# Patient Record
Sex: Female | Born: 1935 | Hispanic: No | Smoking: Former smoker
Health system: Southern US, Community
[De-identification: ages and names within clinical notes are randomized; demographics above are authoritative.]

## PROBLEM LIST (undated history)

## (undated) DIAGNOSIS — I10 Essential (primary) hypertension: Secondary | ICD-10-CM

## (undated) DIAGNOSIS — G1221 Amyotrophic lateral sclerosis: Secondary | ICD-10-CM

## (undated) DIAGNOSIS — I471 Supraventricular tachycardia, unspecified: Secondary | ICD-10-CM

## (undated) DIAGNOSIS — J449 Chronic obstructive pulmonary disease, unspecified: Secondary | ICD-10-CM

## (undated) DIAGNOSIS — K219 Gastro-esophageal reflux disease without esophagitis: Secondary | ICD-10-CM

## (undated) DIAGNOSIS — M1990 Unspecified osteoarthritis, unspecified site: Secondary | ICD-10-CM

## (undated) DIAGNOSIS — E785 Hyperlipidemia, unspecified: Secondary | ICD-10-CM

## (undated) HISTORY — DX: Amyotrophic lateral sclerosis: G12.21

## (undated) HISTORY — PX: BACK SURGERY: SHX140

## (undated) HISTORY — DX: Gastro-esophageal reflux disease without esophagitis: K21.9

## (undated) HISTORY — DX: Supraventricular tachycardia: I47.1

## (undated) HISTORY — DX: Chronic obstructive pulmonary disease, unspecified: J44.9

## (undated) HISTORY — PX: CATARACT EXTRACTION: SUR2

## (undated) HISTORY — DX: Hyperlipidemia, unspecified: E78.5

## (undated) HISTORY — DX: Essential (primary) hypertension: I10

## (undated) HISTORY — DX: Unspecified osteoarthritis, unspecified site: M19.90

## (undated) HISTORY — DX: Supraventricular tachycardia, unspecified: I47.10

---

## 2003-11-15 ENCOUNTER — Ambulatory Visit: Payer: Self-pay | Admitting: Internal Medicine

## 2003-12-21 ENCOUNTER — Ambulatory Visit: Payer: Self-pay | Admitting: Internal Medicine

## 2004-04-12 ENCOUNTER — Ambulatory Visit: Payer: Self-pay | Admitting: Internal Medicine

## 2005-01-29 ENCOUNTER — Ambulatory Visit: Payer: Self-pay | Admitting: Internal Medicine

## 2006-07-17 ENCOUNTER — Ambulatory Visit: Payer: Self-pay | Admitting: Physician Assistant

## 2006-08-26 ENCOUNTER — Ambulatory Visit: Payer: Self-pay | Admitting: Pain Medicine

## 2006-09-10 ENCOUNTER — Ambulatory Visit: Payer: Self-pay | Admitting: Pain Medicine

## 2006-10-02 ENCOUNTER — Ambulatory Visit: Payer: Self-pay | Admitting: Physician Assistant

## 2006-10-14 ENCOUNTER — Emergency Department: Payer: Self-pay | Admitting: Emergency Medicine

## 2006-10-14 ENCOUNTER — Other Ambulatory Visit: Payer: Self-pay

## 2006-11-04 ENCOUNTER — Ambulatory Visit: Payer: Self-pay | Admitting: Pain Medicine

## 2006-11-05 ENCOUNTER — Ambulatory Visit: Payer: Self-pay | Admitting: Pain Medicine

## 2006-11-20 ENCOUNTER — Ambulatory Visit: Payer: Self-pay | Admitting: Physician Assistant

## 2006-12-02 ENCOUNTER — Ambulatory Visit: Payer: Self-pay | Admitting: Pain Medicine

## 2006-12-30 ENCOUNTER — Ambulatory Visit: Payer: Self-pay | Admitting: Pain Medicine

## 2006-12-31 ENCOUNTER — Ambulatory Visit: Payer: Self-pay | Admitting: Pain Medicine

## 2007-02-27 ENCOUNTER — Ambulatory Visit: Payer: Self-pay | Admitting: Family Medicine

## 2007-08-11 ENCOUNTER — Ambulatory Visit: Payer: Self-pay | Admitting: Unknown Physician Specialty

## 2007-09-17 ENCOUNTER — Ambulatory Visit: Payer: Self-pay | Admitting: Pain Medicine

## 2007-09-30 ENCOUNTER — Ambulatory Visit: Payer: Self-pay | Admitting: Pain Medicine

## 2007-10-14 ENCOUNTER — Ambulatory Visit: Payer: Self-pay | Admitting: Physician Assistant

## 2008-04-12 ENCOUNTER — Ambulatory Visit: Payer: Self-pay | Admitting: Internal Medicine

## 2008-09-30 ENCOUNTER — Ambulatory Visit: Payer: Self-pay | Admitting: Orthopedic Surgery

## 2008-10-08 ENCOUNTER — Ambulatory Visit: Payer: Self-pay | Admitting: Unknown Physician Specialty

## 2008-10-08 ENCOUNTER — Ambulatory Visit: Payer: Self-pay | Admitting: Cardiovascular Disease

## 2008-11-02 ENCOUNTER — Inpatient Hospital Stay: Payer: Self-pay | Admitting: Unknown Physician Specialty

## 2009-05-02 ENCOUNTER — Ambulatory Visit: Payer: Self-pay | Admitting: Internal Medicine

## 2009-12-18 ENCOUNTER — Observation Stay: Payer: Self-pay | Admitting: Specialist

## 2009-12-18 ENCOUNTER — Ambulatory Visit: Payer: Self-pay | Admitting: Internal Medicine

## 2010-05-09 ENCOUNTER — Ambulatory Visit: Payer: Self-pay | Admitting: Internal Medicine

## 2010-06-21 ENCOUNTER — Ambulatory Visit: Payer: Self-pay | Admitting: Unknown Physician Specialty

## 2011-10-30 ENCOUNTER — Ambulatory Visit: Payer: Self-pay | Admitting: Internal Medicine

## 2012-03-21 ENCOUNTER — Ambulatory Visit: Payer: Self-pay | Admitting: Unknown Physician Specialty

## 2012-04-14 ENCOUNTER — Ambulatory Visit: Payer: Self-pay | Admitting: Internal Medicine

## 2012-04-15 ENCOUNTER — Ambulatory Visit: Payer: Self-pay | Admitting: Internal Medicine

## 2012-04-28 ENCOUNTER — Ambulatory Visit: Payer: Self-pay | Admitting: Pain Medicine

## 2012-05-08 ENCOUNTER — Ambulatory Visit: Payer: Self-pay | Admitting: Pain Medicine

## 2012-05-28 ENCOUNTER — Ambulatory Visit: Payer: Self-pay | Admitting: Pain Medicine

## 2012-06-05 ENCOUNTER — Ambulatory Visit: Payer: Self-pay | Admitting: Pain Medicine

## 2012-06-24 ENCOUNTER — Ambulatory Visit: Payer: Self-pay | Admitting: Pain Medicine

## 2012-07-07 ENCOUNTER — Ambulatory Visit: Payer: Self-pay | Admitting: Pain Medicine

## 2012-11-26 ENCOUNTER — Ambulatory Visit: Payer: Self-pay | Admitting: Internal Medicine

## 2012-12-24 ENCOUNTER — Ambulatory Visit: Payer: Self-pay | Admitting: Ophthalmology

## 2013-01-21 ENCOUNTER — Ambulatory Visit: Payer: Self-pay | Admitting: Ophthalmology

## 2013-03-24 ENCOUNTER — Ambulatory Visit: Payer: Self-pay | Admitting: Neurology

## 2013-05-13 ENCOUNTER — Ambulatory Visit: Payer: Self-pay | Admitting: Pain Medicine

## 2013-06-01 ENCOUNTER — Ambulatory Visit: Payer: Self-pay | Admitting: Otolaryngology

## 2013-06-02 ENCOUNTER — Ambulatory Visit: Payer: Self-pay | Admitting: Pain Medicine

## 2013-06-22 ENCOUNTER — Other Ambulatory Visit: Payer: Self-pay | Admitting: Pain Medicine

## 2013-06-22 ENCOUNTER — Ambulatory Visit: Payer: Self-pay | Admitting: Pain Medicine

## 2013-06-22 LAB — HEPATIC FUNCTION PANEL A (ARMC)
ALBUMIN: 4.1 g/dL (ref 3.4–5.0)
ALK PHOS: 76 U/L
BILIRUBIN TOTAL: 0.4 mg/dL (ref 0.2–1.0)
Bilirubin, Direct: <0.1 mg/dL (ref 0.00–0.20)
SGOT(AST): 21 U/L (ref 15–37)
SGPT (ALT): 32 U/L (ref 12–78)
TOTAL PROTEIN: 7.1 g/dL (ref 6.4–8.2)

## 2013-06-22 LAB — BASIC METABOLIC PANEL
Anion Gap: 4 — ABNORMAL LOW (ref 7–16)
BUN: 16 mg/dL (ref 7–18)
CHLORIDE: 103 mmol/L (ref 98–107)
CO2: 33 mmol/L — AB (ref 21–32)
Calcium, Total: 9.3 mg/dL (ref 8.5–10.1)
Creatinine: 0.4 mg/dL — ABNORMAL LOW (ref 0.60–1.30)
EGFR (Non-African Amer.): >60
Glucose: 107 mg/dL — ABNORMAL HIGH (ref 65–99)
Osmolality: 281 (ref 275–301)
POTASSIUM: 3.9 mmol/L (ref 3.5–5.1)
Sodium: 140 mmol/L (ref 136–145)

## 2013-06-22 LAB — MAGNESIUM: Magnesium: 1.9 mg/dL

## 2013-06-22 LAB — SEDIMENTATION RATE: ERYTHROCYTE SED RATE: 17 mm/h (ref 0–30)

## 2013-07-13 ENCOUNTER — Ambulatory Visit: Payer: Self-pay | Admitting: Pain Medicine

## 2013-08-06 ENCOUNTER — Ambulatory Visit: Payer: Self-pay | Admitting: Neurology

## 2013-08-17 IMAGING — CT CT HEAD WITHOUT CONTRAST
1 series · 16 of 30 positions shown, 20 images · non-contrast
Comparison: none

REASON FOR EXAM: STAT CallReport [id number]  fall  pt hit right Carmino
eval fracture or hemorragia
COMMENTS:

PROCEDURE:     ABIGGY - FINA MIRALDA WITHOUT CONTRAST  - April 14, 2012 [DATE]
RESULT:     History: Fall.
Comparison Study: Head CT of 10/14/2006.

[Series 3: soft tissue · axial · 0.41mm/px · z∈[-124,+11]mm · 16 of 31 slices shown, 20 images]
[im 2/31  brain]
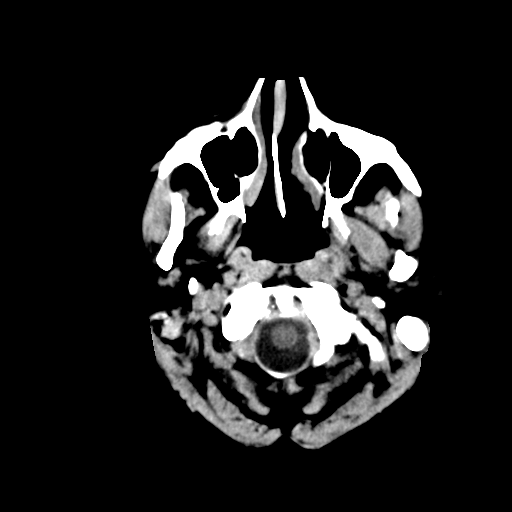
[im 2/31  bone]
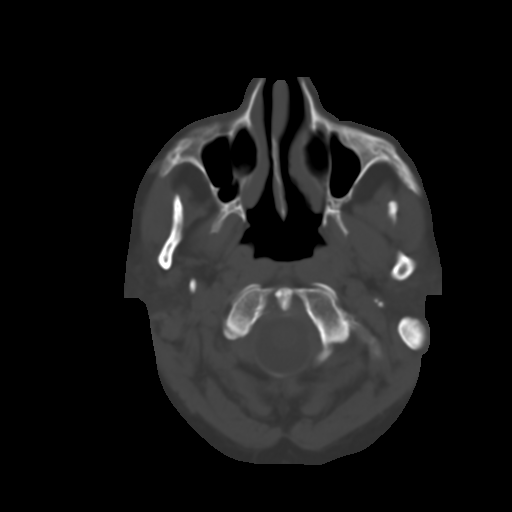
[im 4/31  brain]
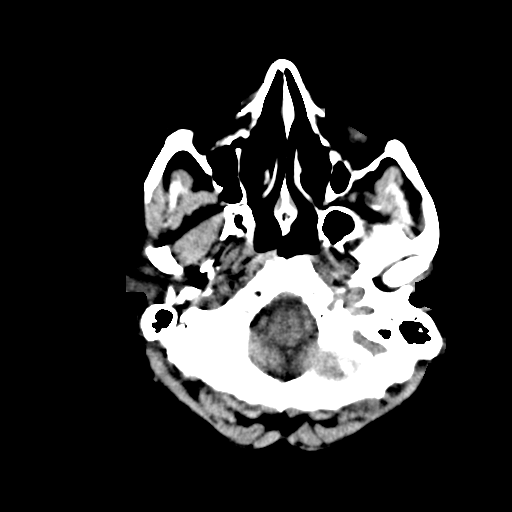
[im 6/31  brain]
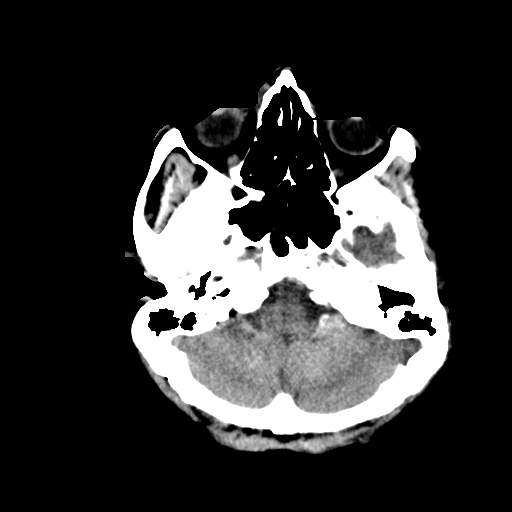
[im 8/31  brain]
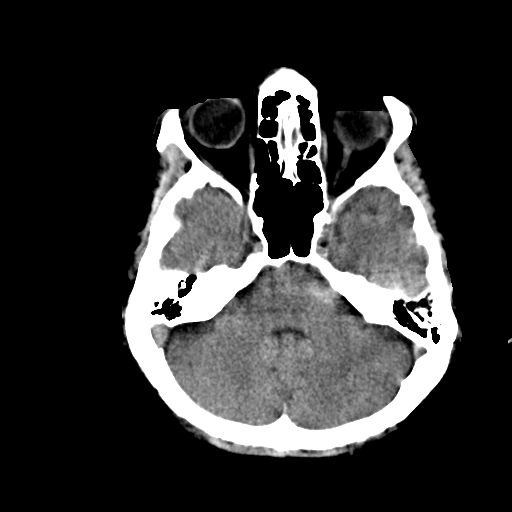
[im 9/31  brain]
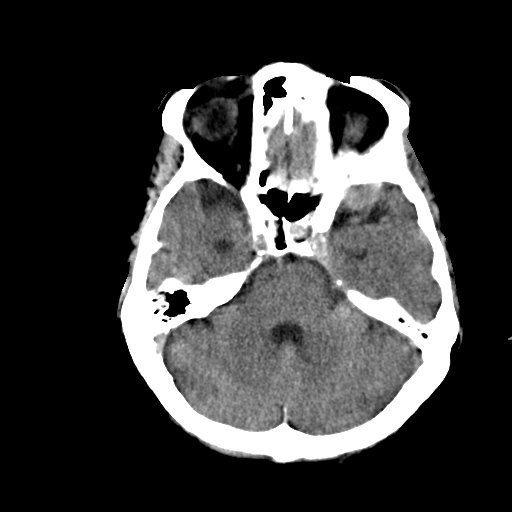
[im 9/31  bone]
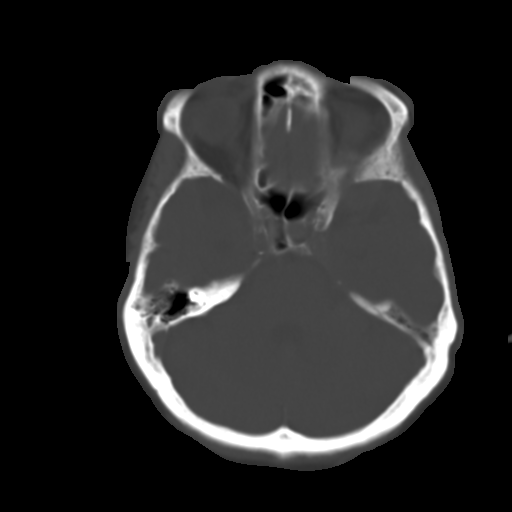
[im 11/31  brain]
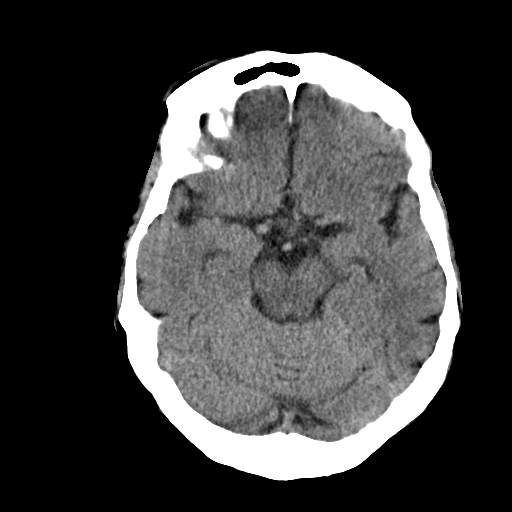
[im 13/31  brain]
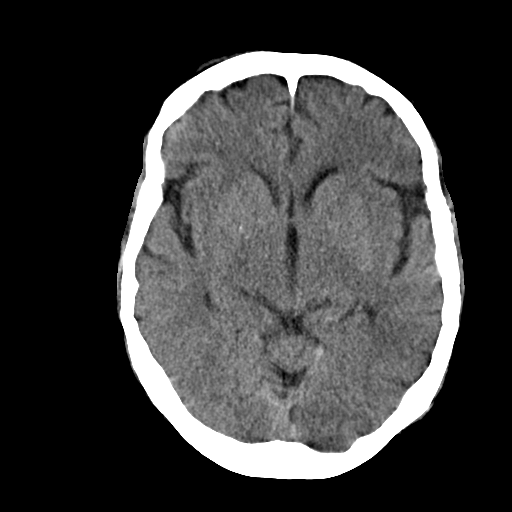
[im 15/31  brain]
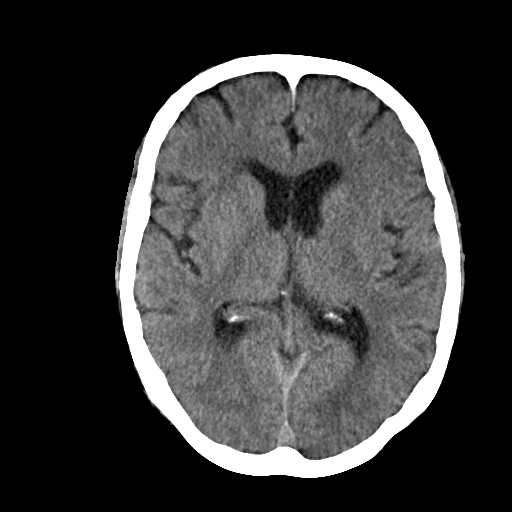
[im 16/31  brain]
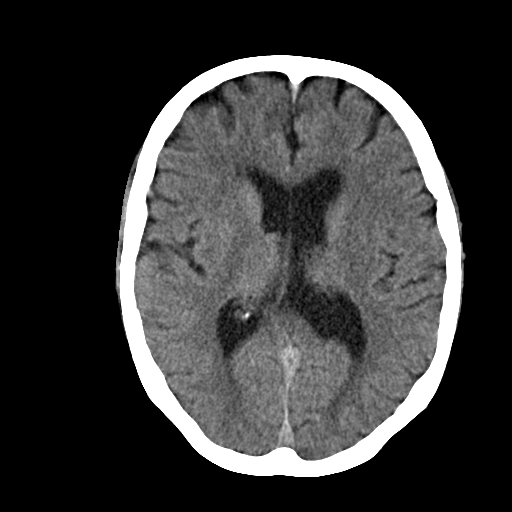
[im 16/31  bone]
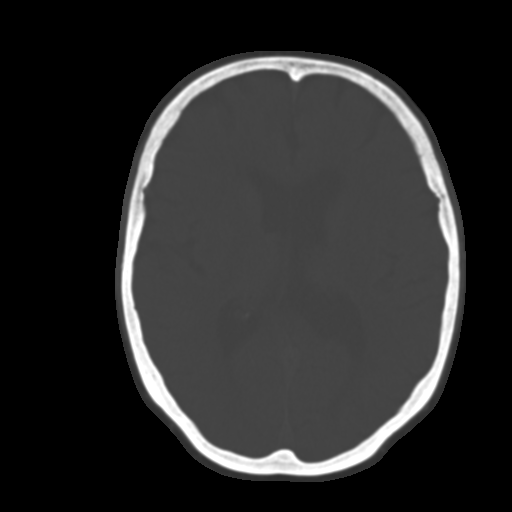
[im 18/31  brain]
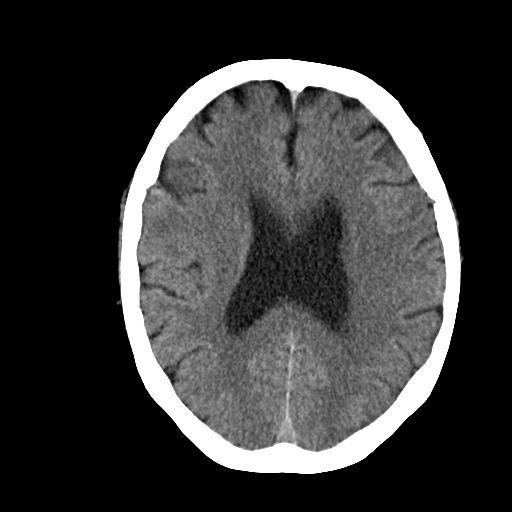
[im 20/31  brain]
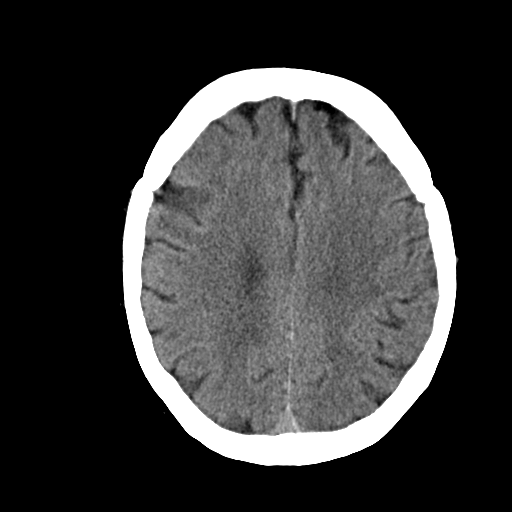
[im 22/31  brain]
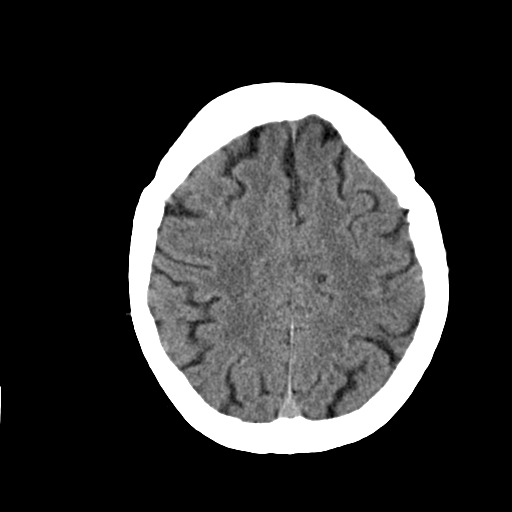
[im 23/31  brain]
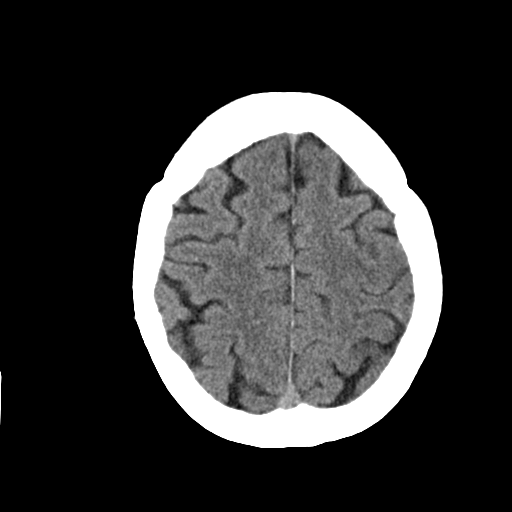
[im 23/31  bone]
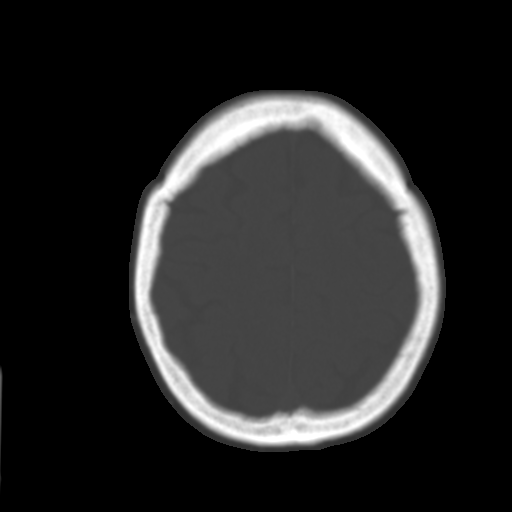
[im 25/31  brain]
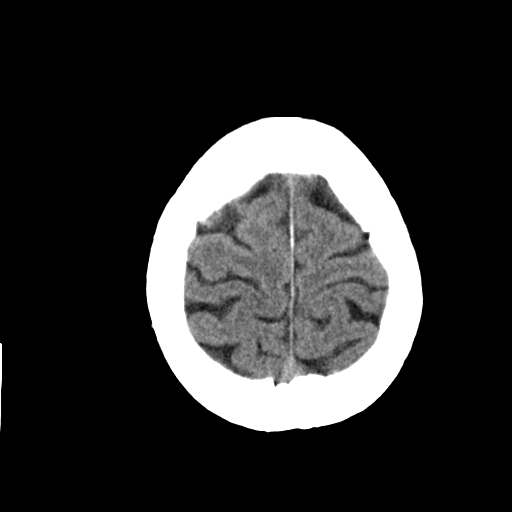
[im 27/31  brain]
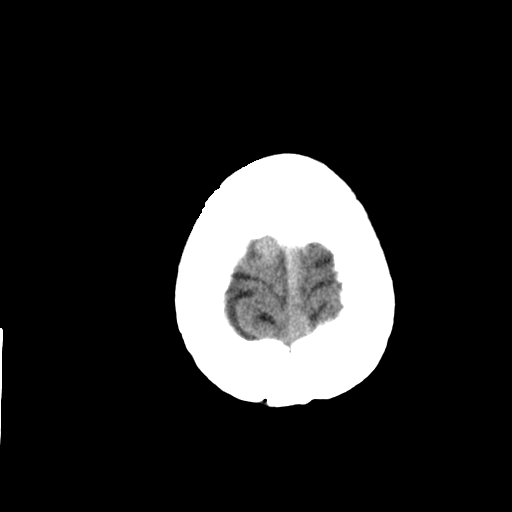
[im 29/31  brain]
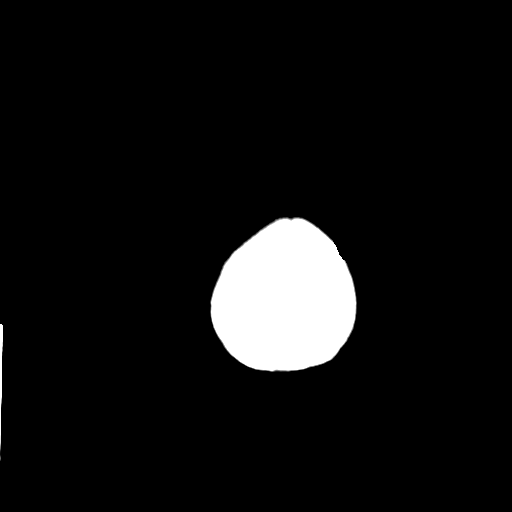

[16 of 30 positions shown; findings below may reference images not displayed]

FINDINGS: Findings: No mass. No hydrocephalus. Stable basal ganglia
calcification. Subtle increased density is no the region of the left
ventrolateral pons. Is most likely artifact however focal small hemorrhage
cannot be excluded followup scan an 12 24-hour suggested. No acute bony
abnormality. Report phoned to patient's physician at time of study.
IMPRESSION: Subtle focus of increased density noted adjacent to the
pons on the left. This is most likely artifact secondary to artifact, to
exclude  significant hemorrhage followup scan [DATE]-hour suggested .

## 2013-08-18 IMAGING — CT CT HEAD WITHOUT CONTRAST
1 series · 16 of 30 positions shown, 20 images · non-contrast
Comparison: none

REASON FOR EXAM: fall eval fracture or bleeding
COMMENTS:

[Series 2: soft tissue · axial · 0.39mm/px · z∈[-68,+67]mm · 16 of 30 slices shown, 20 images]
[im 2/30  brain]
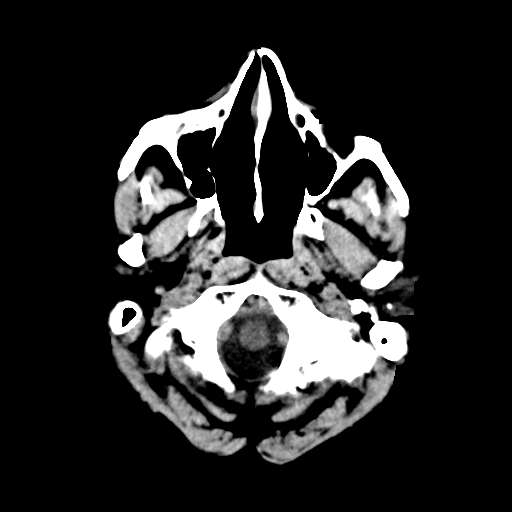
[im 2/30  bone]
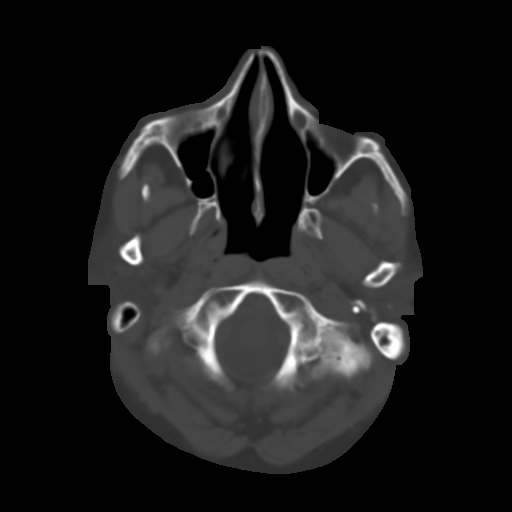
[im 4/30  brain]
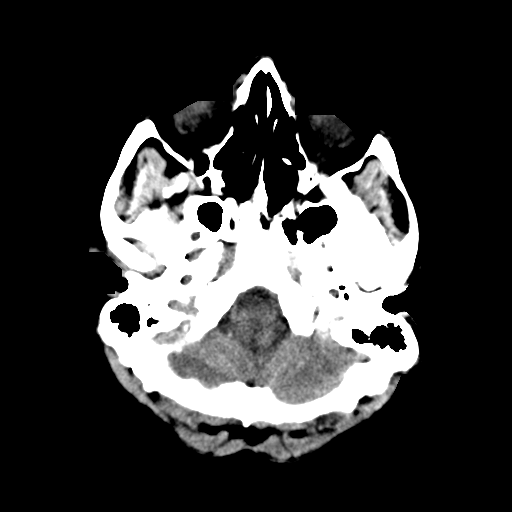
[im 6/30  brain]
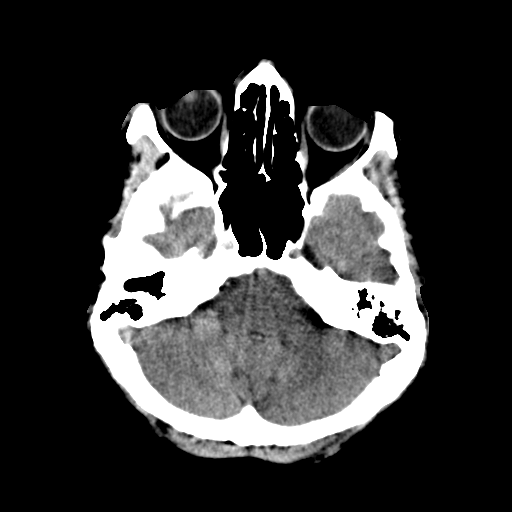
[im 8/30  brain]
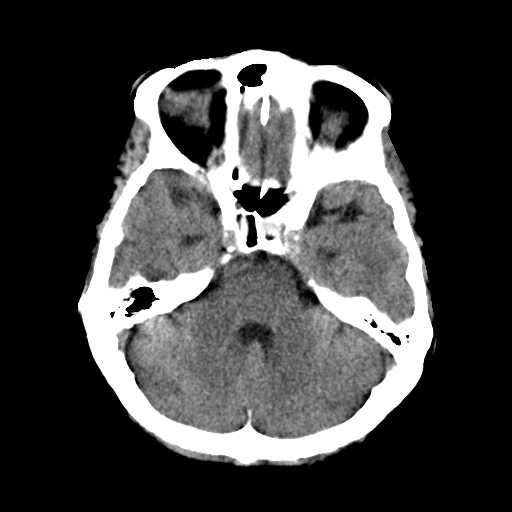
[im 9/30  brain]
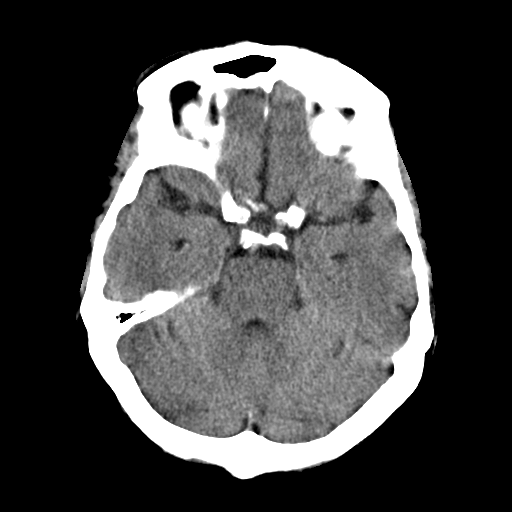
[im 9/30  bone]
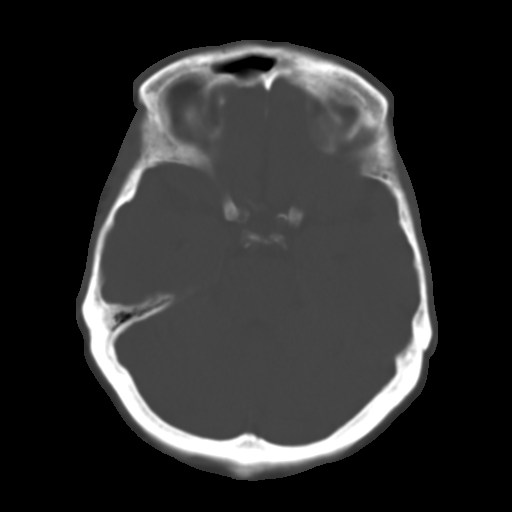
[im 11/30  brain]
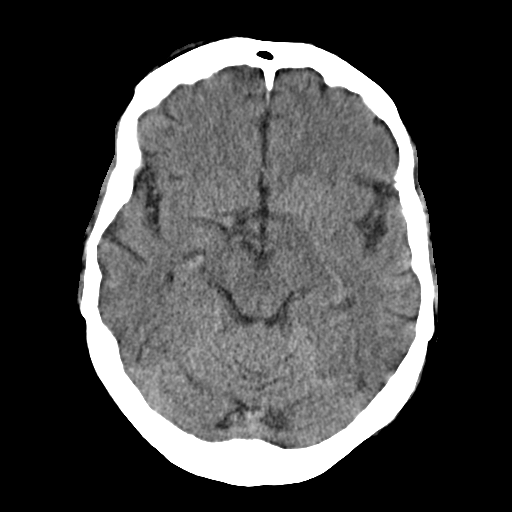
[im 13/30  brain]
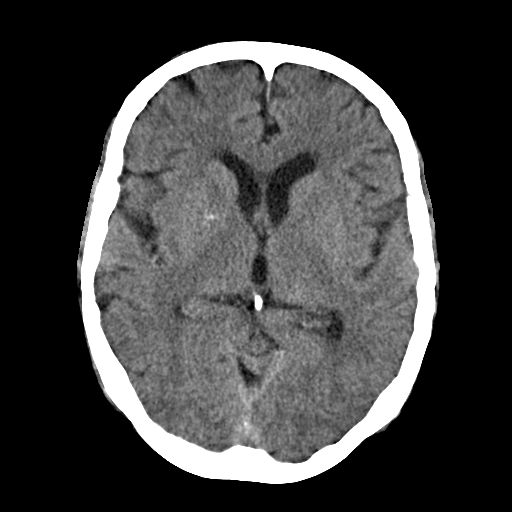
[im 15/30  brain]
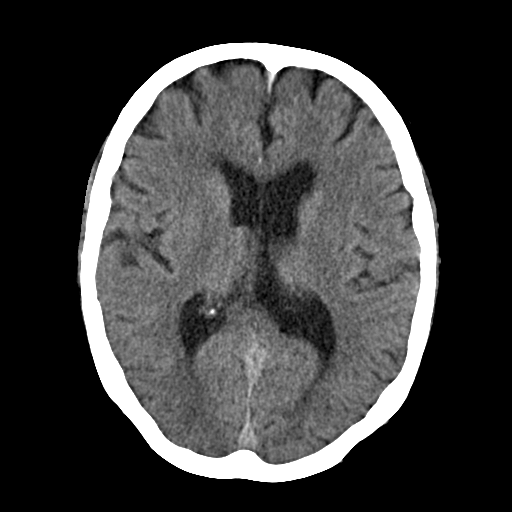
[im 16/30  brain]
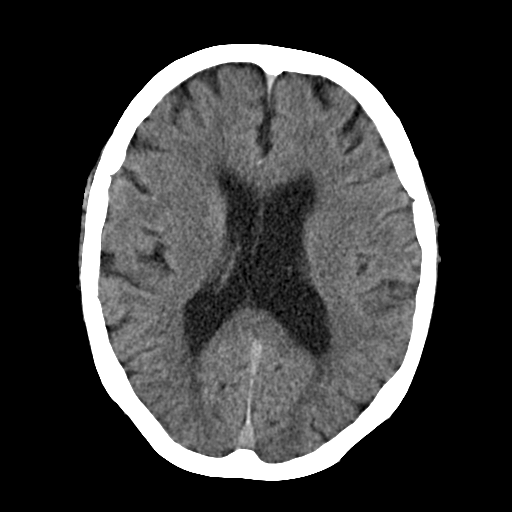
[im 16/30  bone]
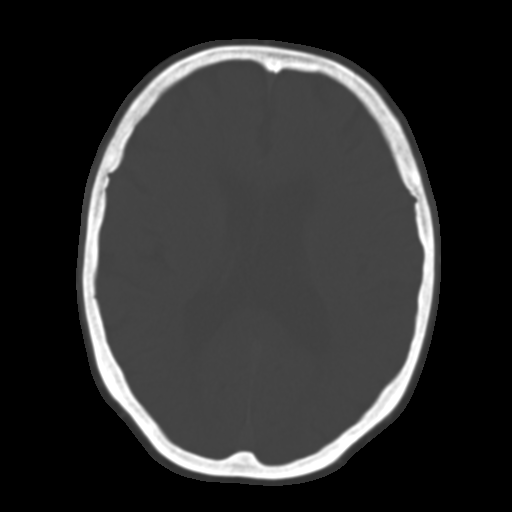
[im 18/30  brain]
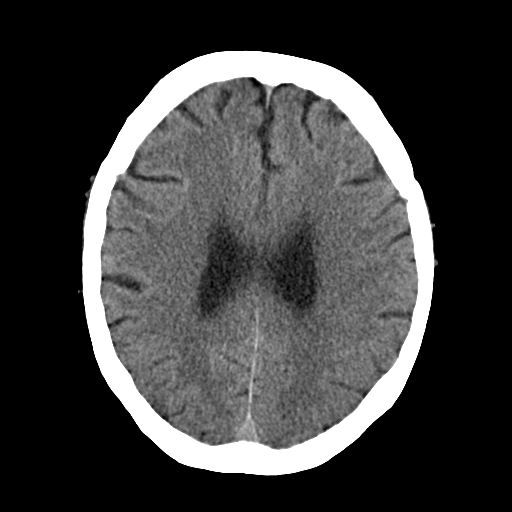
[im 20/30  brain]
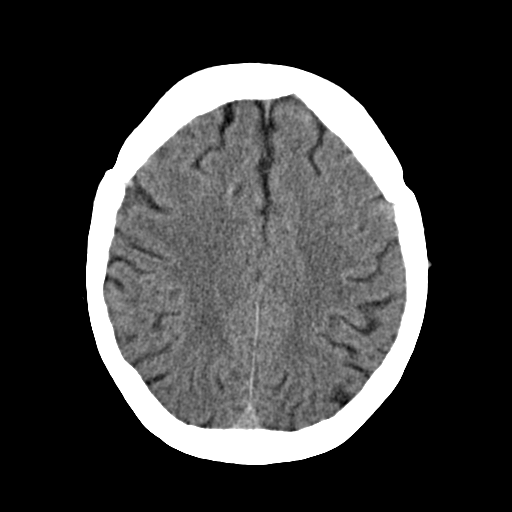
[im 22/30  brain]
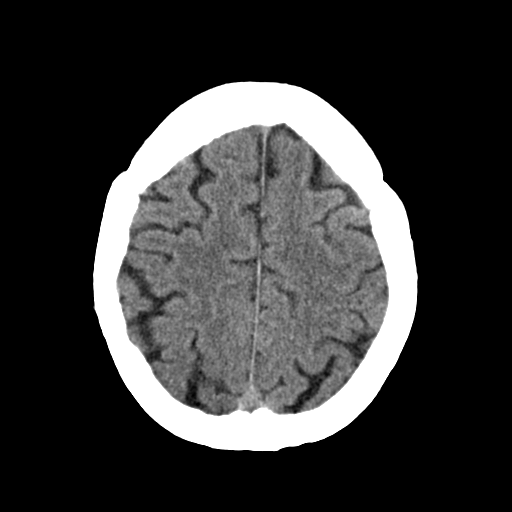
[im 23/30  brain]
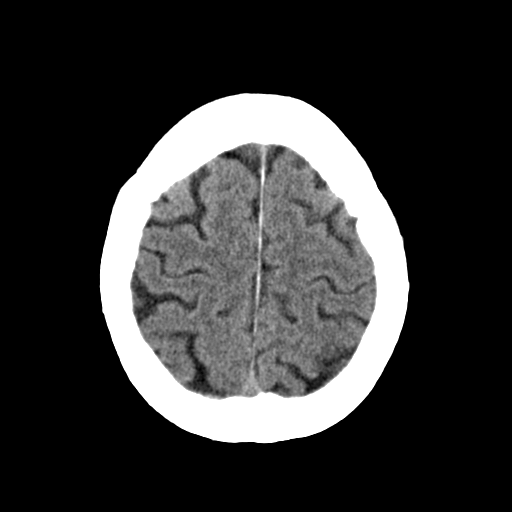
[im 23/30  bone]
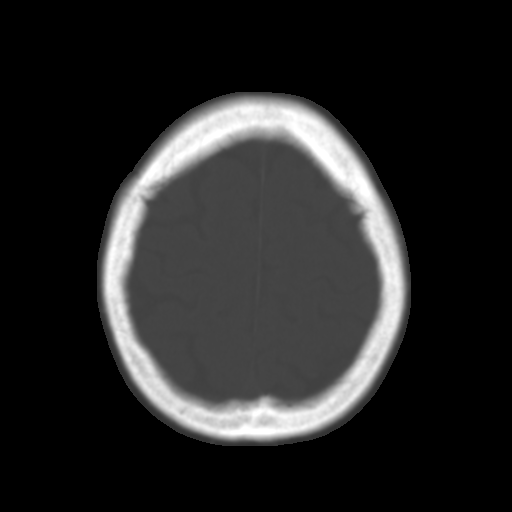
[im 25/30  brain]
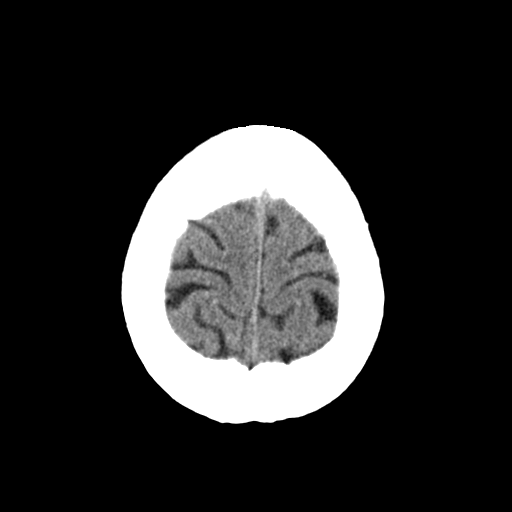
[im 27/30  brain]
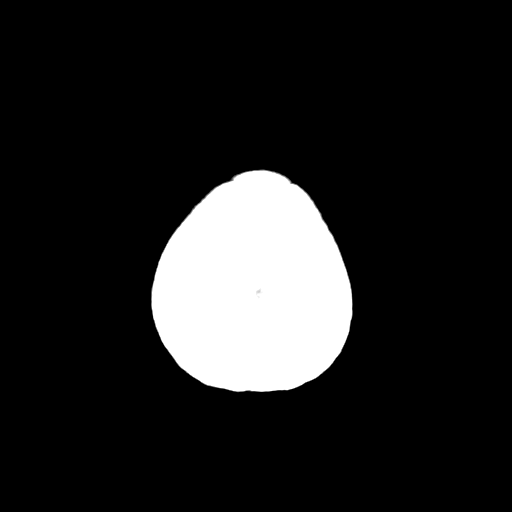
[im 29/30  brain]
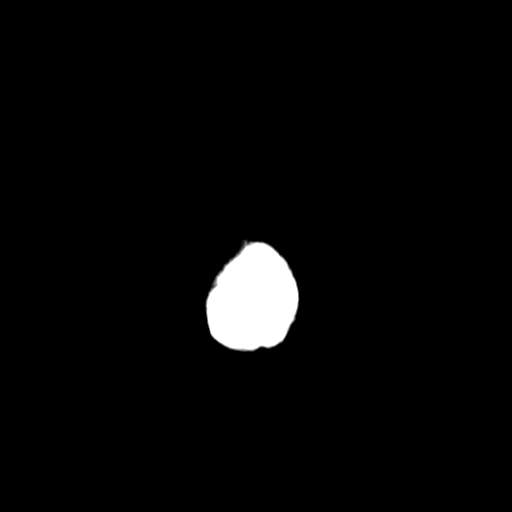

[16 of 30 positions shown; findings below may reference images not displayed]

PROCEDURE:     POLSON - ANAY KG WITHOUT CONTRAST  - April 15, 2012 [DATE]

RESULT:     Comparison is made to previous noncontrast study 14 April, 2012. The study shows minimal atrophy within normal limits for the patient's
age. There is stable basal ganglia calcification. The area of subtle
increased attenuation along the anterolateral left pons is less prominent an
likely artifact. There is no evidence of hemorrhage, mass, mass effect or
evolving infarct. There is right supraorbital a frontal hematoma. The
calvarium is intact. The mastoid air cells and sinuses are clear.
IMPRESSION: 1. No acute intracranial abnormality. Mild atrophy. Decreasing prominence of
the subtle area of slightly increased attenuation along the left pontine
region which is likely artifact.

## 2013-08-28 ENCOUNTER — Encounter: Payer: Self-pay | Admitting: Neurology

## 2013-08-28 ENCOUNTER — Ambulatory Visit (INDEPENDENT_AMBULATORY_CARE_PROVIDER_SITE_OTHER): Payer: Medicare Other | Admitting: Neurology

## 2013-08-28 VITALS — BP 138/84 | HR 52 | Ht 64.0 in | Wt 150.0 lb

## 2013-08-28 DIAGNOSIS — G1221 Amyotrophic lateral sclerosis: Secondary | ICD-10-CM

## 2013-08-28 DIAGNOSIS — R471 Dysarthria and anarthria: Secondary | ICD-10-CM

## 2013-08-28 DIAGNOSIS — G1220 Motor neuron disease, unspecified: Secondary | ICD-10-CM

## 2013-08-28 DIAGNOSIS — R29898 Other symptoms and signs involving the musculoskeletal system: Secondary | ICD-10-CM

## 2013-08-28 DIAGNOSIS — R1310 Dysphagia, unspecified: Secondary | ICD-10-CM

## 2013-08-28 NOTE — Progress Notes (Signed)
Elizabeth Elizabeth [name]   Date: 08/31/2013  ADI SEALES MRN: [id number] DOB: 1933-12-20   Dear Dr. Melrose Nakayama:  Thank you for your kind referral of Elizabeth Elizabeth [name] for consultation of bilateral leg weakness and dysarthria. Although her history is well known to you, please allow [place] to reiterate it for the purpose of our medical record. The patient was accompanied to the clinic by niece Elizabeth Elizabeth [name]) and son's girl friend Elizabeth Elizabeth [name]) who also provides collateral information.     History of Present Illness: Elizabeth Elizabeth [name] is a 80 y.o. right-handed Caucasian female with history of hypertension, SVT, GERD, lumbar spinal stenosis s/p lumbar decompression and fusion (2005) presenting for evaluation of bilateral leg weakness and slurred speech.    Starting around fall 2013, she developed low back pain and underwent epidural injection x 4 in April 2015.  Following her second injection, she immediately developed tingling of the right toes which she recalls telling her pain medicine physician because it occurred while still in the office.  Within a few weeks, she developed numbness of the right lower lateral leg and top of the foot.  Her was told to follow-up with PCP who referred her to Dr. Melrose Nakayama.  Electrodiagnostic testing showed acute on chronic lower lumbosacral radiculopathy on the right with ongoing denervation seen in the deep the superficial fibula nerves.  Around summer 2014, she developed right leg swelling and skin started sloughing off, (right > left).  She saw dermatology who diagnosed her with vasculitis which was confirmed by Rheumatology and given short course of prednisone.  This improved her swelling and skin changes, but her numbness/tingling remained unchanged.   In the summer of 2015, weakness of the left leg (proximal and distal) ensued. By thanksgiving, she was using a walker and by Christmas, she was wheelchair bound.  She last stood up independently in  December.  Over the past several months, her leg weakness has steadily progressed and now she is wheelchair-bound.  In March 2015, she noted problems with slurred speech. She also has difficulty swallowing both liquids and solids.     Denies sensory complaints of the arms, right lower extremity, tea-colored urine, muscle tenderness, cramps, or switches.   She is getting PT for her her arms.  She has normal MBS which was performed ~April 2015.  She has not done speech, swallow therapy.  She denies any shortness of breath and is currently sleeping on one pillow.  Currently, she needs assistance for transfers including toileting, but is managing by herself and with assistance of family. At her last Elizabeth [name] with Dr. Melrose Nakayama, home healthcare referral was made. Her family his trying to get into an assisted living facility.      Past Medical History  Diagnosis Date  . Hypertension   . SVT (supraventricular tachycardia)   . GERD (gastroesophageal reflux disease)   . OA (osteoarthritis)   . COPD (chronic obstructive pulmonary disease)   . Hyperlipidemia     Past Surgical History  Procedure Laterality Date  . Back surgery      Lumbar  . Cataract extraction       Medications:  Albuterol 90 mcg inhaler use every 6 hours as needed Aspirin 81 mg daily Atenolol 25 mg daily Hydrochlorothiazide 12.5 mg daily Lopid 7.5 mg daily Prilosec 20 mg daily Ultram 50 mg every 6 hours as needed for Multivitamin daily  Allergies: No Known Allergies  Family History: Family History  Problem Relation Age of Onset  .  Heart attack Mother     Deceased, 63  . Other Father     Deceased, 43  . Diabetes Sister   . Stroke Sister     Deceased  . COPD Son   . Hypertension Son     Social History: History   Social History  . Marital Status: Widowed    Spouse Name: N/A    Number of Children: N/A  . Years of Education: N/A   Occupational History  . Not on file.   Social History Main Topics  .  Smoking status: Former Smoker -- 0.25 packs/day for 20 years    Types: Cigarettes  . Smokeless tobacco: Not on file     Comment: Quit age of 59  . Alcohol Use: No  . Drug Use: No  . Sexual Activity: Not on file   Other Topics Concern  . Not on file   Social History Narrative   Her son lives with her in a one-story home.   She retired from working in a [employer].   Highest level of education:  7th grade.    Review of Systems:  CONSTITUTIONAL: No fevers, chills, night sweats, + weight loss.   EYES: No visual changes or eye pain ENT: No hearing changes.  No history of nose bleeds.   RESPIRATORY: No cough, wheezing and shortness of breath.   CARDIOVASCULAR: Negative for chest pain, and palpitations.   GI: Negative for abdominal discomfort, blood in stools or black stools.  No recent change in bowel habits.   GU:  No history of incontinence.   MUSCLOSKELETAL: No history of joint pain or swelling.  No myalgias.  + generalized weakness SKIN: Negative for lesions, rash, and itching.   HEMATOLOGY/ONCOLOGY: Negative for prolonged bleeding, bruising easily, and swollen nodes.   ENDOCRINE: Negative for cold or heat intolerance, polydipsia or goiter.   PSYCH:  No depression or anxiety symptoms.   NEURO: As Above.   Vital Signs:  BP 138/84  Pulse 52  Ht 5' 4"  (1.626 m)  Wt 150 lb (68.04 kg)  BMI 25.73 kg/m2  SpO2 94%   General Medical Exam:   General:  Well appearing, comfortable.   Eyes/ENT: see cranial nerve examination.   Neck: No masses appreciated.  Full range of motion without tenderness.  No carotid bruits. Respiratory:  Clear to auscultation, good air entry bilaterally.   Cardiac:  Regular rate and rhythm, no murmur.   Skin:  Skin color, texture, turgor normal. No rashes or lesions.  Neurological Exam: MENTAL STATUS including orientation to time, place, person, recent and remote memory, attention span and concentration, language, and fund of knowledge is normal.  Speech  is slightly dysarthric and slow, especially with lingual sounds 100% intelligible  CRANIAL NERVES: II:  No visual field defects.  Unremarkable fundi.   III-IV-VI: Pupils equal round and reactive to light.  Normal conjugate, extra-ocular eye movements in all directions of gaze.  No nystagmus.  No ptosis. V:  Normal facial sensation.  Jaw jerk is present.   VII:  Normal facial symmetry and movements.  There is evidence of facial weakness as evidenced by inability to maintain air in puffed cheeks, and orbicularis auris weakness (4/5). Snout reflex and Myerson sign is present  VIII:  Normal hearing and vestibular function.   IX-X:  Normal palatal movement.   XI:  Normal shoulder shrug and head rotation.   XII:   Tongue strength is reduced 4/5 and she unable to completely took her tongue into  her cheek, trunk range of motion and movements is also severely reduced. No fasciculations were seen of the tongue.  MOTOR: Prominent lower extremity atrophy, especially of the quadriceps and tibialis anterior bilaterally, worse on the right.  Fasciculations are seen over the right biceps, triceps, and forearm muscles with rare fasciculations involving the left upper extremity. No fasciculations are seen in the lower extremity.  No pronator drift.  Tone is reduced in the legs bilaterally.  Right Upper Extremity:    Left Upper Extremity:    Deltoid  5-/5  Deltoid  5-/5   Biceps  4+/5  Biceps  5-/5   Triceps  4+/5  Triceps  5-/5   Wrist extensors  4+/5  Wrist extensors  5-/5   Wrist flexors  4+/5  Wrist flexors  5/5   Finger extensors  4/5   Finger extensors  4+/5   Finger flexors  4/5   Finger flexors  4+/5   Dorsal interossei  4/5   Dorsal interossei  4+/5   Abductor pollicis  4/5   Abductor pollicis  4+/5   Tone (Ashworth scale)  0  Tone (Ashworth scale)  0   Right Lower Extremity:    Left Lower Extremity:    Hip flexors  1/5   Hip flexors  3/5   Hip extensors  1/5   Hip extensors  3/5   Knee flexors   3/5   Knee flexors  3/5   Knee extensors  3/5   Knee extensors  3+/5   Dorsiflexors  0/5   Dorsiflexors  1/5   Plantarflexors  0/5   Plantarflexors  1/5   Toe extensors  0/5   Toe extensors  0/5   Toe flexors  0/5   Toe flexors  0/5   Tone (Ashworth scale)  0  Tone (Ashworth scale)  0   MSRs:  Right                                                                 Left brachioradialis 3+  brachioradialis 3+  biceps 3+  biceps 3+  triceps 3+  triceps 3+  patellar 3+  patellar 3+  ankle jerk 1+  ankle jerk 1+  Hoffman no  Hoffman no  plantar response up  plantar response mute   SENSORY:  Vibration is reduced distal to ankles bilaterally. Pinprick is intact throughout.   COORDINATION/GAIT: Normal finger-to- nose-finger.  Slightly slowed finger tapping movements bilaterally. Unable to assess heel or toe tapping due to weakness. She is unable to rise from a chair even with using arms. Gait not assessed due to patient's severe lower extremity weakness.   Data: Labs 06/20/18/15: CK 210, sodium 139, potassium 1.4, creatinine 0.5, BUN creatinine ratio 32*, aldolase 6.1, vitamin B12 587, ESR 10, TSH 1.3  EMG 05/07/2012: Acute on chronic lower lumbosacral radiculopathy on the left. Ongoing denervation seen in the deep and superficial fibula nerves.  MRI brain without contrast 03/24/2013: Slight progression of periventricular white matter changes. This likely reflects progressive small vessel disease. No intracranial abnormality.  MRI lumbar spine without contrast 03/21/2012: Multilevel degenerative disc disease with disc protrusion at L2-L3, L3-L4, L4-L5, and L5-S1, with bilateral neural foraminal narrowing. Postsurgical changes are noted.  IMPRESSION/PLAN: Ms. Boal is a 80 year old female presenting  for evaluation of progressive painless lower extremity weakness and dysarthria since spring 2014.  Her neurological examination is notable for lower and upper motor neuron findings involving the bulbar,  cervical, and lumbosacral regions. Additionally, there is evidence of fasciculations especially notable over the right upper extremity, which the patient has previously not noted.  Based on her history and exam, motor neuron disease is high on the differential.  Her previous EMG of the right lower extremity in April 2014 showed active and chronic motor axon loss changes. I would like to repeat EMG of the right upper and lower extremity, with additional muscles of the face. I will also check CK, aldolase, copper, ceruloplasmin, SPEP/UPEP with IFE.  For her dysphagia, modified barium swallow will be ordered. In the meantime, I have instructed her to start using thickener to liquids and eat soft foods only.  I did urge the family to look into nursing facility, as home safety should be a primary goal is we and continue to investigate her symptoms.  Information on LifeAlert system was provided.  Return to clinic in 6-8 weeks.   The duration of this appointment Elizabeth [name] was 65 minutes of face-to-face time with the patient.  Greater than 50% of this time was spent in counseling, explanation of diagnosis, planning of further management, and coordination of care.   Thank you for allowing me to participate in patient's care.  If I can answer any additional questions, I would be pleased to do so.    Sincerely,    Andres Escandon K. Posey Pronto, DO

## 2013-08-28 NOTE — Patient Instructions (Addendum)
1.  Check blood work 2.  NCS/EMG right upper and lower extremity - schedule 3pm  3.  Modified barium swallow 4.  Return to clinic in 932-months  Life Alert Resources  Medical Alert Systems that can locate patients outside the home:  http://mobilealertsystems.com/ 519-081-08951-(418)181-0481 http://www.lifelinesys.com/content/lifeline-products/get-life-gosafe  406-019-75481-(985)424-1353 www.verizonwireless.com/sure/ 718 002 59611-651-739-8256  Medial Alert systems that link to smart phones:  http://www.lifelinesys.com/content/lifeline-products/response-app https://www.montgomery-brown.info/http://www.greatcall.com/medical-apps/Fivestar https://www.bond-cox.org/http://www.lifealert.com/EmergencyMobileApp.aspx  Alzheimer's Association GPS Tracker:  http://www.gould-leon.com/http://www.alz.org/comfortzone/ 832-015-78921.508-727-9417

## 2013-08-29 LAB — CK: Total CK: 120 U/L (ref 7–177)

## 2013-08-30 LAB — ALDOLASE: Aldolase: 8.1 U/L (ref <=8.1)

## 2013-08-31 DIAGNOSIS — G1220 Motor neuron disease, unspecified: Secondary | ICD-10-CM | POA: Insufficient documentation

## 2013-08-31 LAB — CERULOPLASMIN: Ceruloplasmin: 31 mg/dL (ref 18–53)

## 2013-08-31 LAB — COPPER, SERUM: Copper: 126 ug/dL (ref 70–175)

## 2013-09-01 ENCOUNTER — Other Ambulatory Visit: Payer: Self-pay | Admitting: *Deleted

## 2013-09-01 DIAGNOSIS — R1310 Dysphagia, unspecified: Secondary | ICD-10-CM

## 2013-09-01 LAB — UIFE/LIGHT CHAINS/TP QN, 24-HR UR
ALPHA 1 UR: DETECTED — AB
Albumin, U: DETECTED
Alpha 2, Urine: DETECTED — AB
Beta, Urine: DETECTED — AB
FREE KAPPA LT CHAINS, UR: 1.57 mg/dL (ref 0.14–2.42)
FREE KAPPA/LAMBDA RATIO: 8.72 ratio (ref 2.04–10.37)
FREE LAMBDA LT CHAINS, UR: 0.18 mg/dL (ref 0.02–0.67)
Gamma Globulin, Urine: DETECTED — AB
TOTAL PROTEIN, URINE-UPE24: 5.7 mg/dL

## 2013-09-01 LAB — SPEP & IFE WITH QIG
ALPHA-1-GLOBULIN: 4 % (ref 2.9–4.9)
Albumin ELP: 61.4 % (ref 55.8–66.1)
Alpha-2-Globulin: 11.8 % (ref 7.1–11.8)
Beta 2: 5.1 % (ref 3.2–6.5)
Beta Globulin: 6.3 % (ref 4.7–7.2)
GAMMA GLOBULIN: 11.4 % (ref 11.1–18.8)
IGM, SERUM: 71 mg/dL (ref 52–322)
IgA: 116 mg/dL (ref 69–380)
IgG (Immunoglobin G), Serum: 819 mg/dL (ref 690–1700)
Total Protein, Serum Electrophoresis: 6.7 g/dL (ref 6.0–8.3)

## 2013-09-07 ENCOUNTER — Ambulatory Visit: Payer: Self-pay | Admitting: Neurology

## 2013-09-10 NOTE — Progress Notes (Signed)
Addendum:  MBS performed at [facility] dated 09/07/2013: Decreased tongue control/strength/ROM resuling in oral residue after the swallow and residue at the base of the tongue.  No aspiration  Kaden Daughdrill K. Allena KatzPatel, DO

## 2013-09-17 ENCOUNTER — Ambulatory Visit (INDEPENDENT_AMBULATORY_CARE_PROVIDER_SITE_OTHER): Payer: Medicare Other | Admitting: Neurology

## 2013-09-17 DIAGNOSIS — R471 Dysarthria and anarthria: Secondary | ICD-10-CM

## 2013-09-17 DIAGNOSIS — R29898 Other symptoms and signs involving the musculoskeletal system: Secondary | ICD-10-CM

## 2013-09-17 DIAGNOSIS — R1310 Dysphagia, unspecified: Secondary | ICD-10-CM

## 2013-09-17 NOTE — Procedures (Signed)
[facility]  [address] [place], [address]  [place], [place] [address] Tel: [phone] Fax:  [phone] Test Date:  09/17/2013  Patient: Elizabeth [name] DOB: September 26, 1933 Physician: Nita Sickle, DO  Sex: Female Height:  Ref Phys: Nita Sickle  ID#: [id number] Temp: 31.0C Technician: Ala Bent R. NCS T.   Patient Complaints: Patient is a 80 year old female here for evaluation of progressive painless weakness of the arms and legs, dysphagia, and muscle twitches.  NCV & EMG Findings: Extensive evaluation of the right upper extremity, lower extremity, thoracic paraspinal muscles (T6 and T11), and bulbar muscles reveal the following: 1. The median, ulnar, and radial sensory responses are normal. 2. Absent sural and superficial peroneal sensory responses may be a normal finding for patient's age. 3. All motor responses in the upper and lower extremity are reduced in amplitude; the peroneal and tibial motor recorded are barely present. 4. Active on chronic motor axon loss affecting C5-C8 and L2-L4 myotomes.  Despite maximal activation, there was no motor unit recruitment in muscles below the knee, however, there is evidence of active ongoing denervation in the L5-S1 myotomes. 5. Active motor axon loss is present in the thoracic paraspinal muscle at T6 only. 6. Chronic motor axon loss changes present in the mentalis, massater, and hypoglossus muscles, with active denervation isolated to the hygoglossus and massater muscles.  7. Fasciculation potentials are rare and seen in 8 of 17 muscles.  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  There is no evidence of a generalized myopathy.   ___________________________ Nita Sickle,  DO    Nerve Conduction Studies Anti Sensory Summary Table   Site NR Peak (ms) Norm Peak (ms) P-T Amp (V) Norm P-T Amp  Right Median Anti Sensory (2nd Digit)  31C  Wrist    3.4 <3.8 14.4 >10  Right Radial Anti Sensory (Base 1st Digit)  31C  Wrist    2.2 <2.8 29.0 >10  Right Sup Peroneal Anti Sensory (Ant Lat Mall)  31C  12 cm NR  <4.6  >3  Right Sural Anti Sensory (Lat Mall)  Calf NR  <4.6  >3  Right Ulnar Anti Sensory (5th Digit)  31C  Wrist    2.7 <3.2 5.3 >5   Motor Summary Table   Site NR Onset (ms) Norm Onset (ms) O-P Amp (mV) Norm O-P Amp Site1 Site2 Delta-0 (ms) Dist (cm) Vel (m/s) Norm Vel (m/s)  Right Median Motor (Abd Poll Brev)  31C  Wrist    4.2 <4.0 2.2 >5 Elbow Wrist 4.5 23.0 51 >50  Elbow    8.7  2.0  Post Exercise Elbow 4.2 0.0    Post Exercise    4.5  2.6         Right Peroneal Motor (Ext Dig Brev)  31C  Ankle    5.8 <6.0 0.2 >2.5 B Fib Ankle 6.7 28.0 42 >40  B Fib    12.5  0.2  Poplt B Fib 2.3 10.0 43 >40  Poplt    14.8  0.2         Right Peroneal TA Motor (Tib Ant)  31C  Fib Head    4.2 <4.5 0.2 >3 Poplit Fib Head 1.4 9.0 64 >40  Poplit    5.6  0.2         Right Tibial Motor (Abd Hall Brev)  31C  Ankle    5.2 <6.0 0.4 >4 Knee Ankle 10.3 38.0 37 >40  Knee    15.5  0.2         Post Exercise    6.7  0.2         Right Ulnar Motor (Abd Dig Minimi)  31C  Wrist    3.0 <3.1 3.0 >7 B Elbow Wrist 3.9 21.0 54 >50  B Elbow    6.9  2.5  A Elbow B Elbow 1.9 10.0 53 >50  A Elbow    8.8  2.2  Post Exercise A Elbow 5.8 0.0    Post Exercise    3.0  3.2         Right Ulnar (FDI) Motor (1st DI)  31C  Wrist    4.2 <4.5 2.1 >7         EMG   Side Muscle Ins Act Fibs Psw Fasc Number Recrt Dur Dur. Amp Amp. Poly Poly. Comment  Right Lumbo Parasp Low Nml 1+ Nml Nml NE - - - - - - - N/A  Right T11 Parasp Nml Nml Nml Nml NE - - - - - - - N/A  Right RectFemoris Nml 1+ Nml 1+ SMU Mod-R All 1+ All 1+ All 1+ N/A  Right Mentalis Nml Nml Nml Nml 1- Mod-R Some 1+ Nml Nml  Nml Nml N/A  Right Hyoglossus Nml 1+ Nml 1+ 1- Mod-V Some 1+ Nml Nml Nml Nml N/A  Right Ext Indicis Nml Nml Nml Nml 1- Mod-V Few 1+ Some 1+ Nml Nml N/A  Right AntTibialis Nml 2+ Nml Nml None None - - - - - - ATR  Right Flex Dig Long 1- Nml Nml Nml None None - - - - - - ATR  Right Gastroc Nml 1+ Nml Nml None None - - - - - - ATR  Right Biceps Nml 1+ Nml 1+ 2- Rapid Some 1+ Some 1+ Some 1+ N/A  Right PronatorTeres Nml 1+ Nml 1+ 2- Rapid Some 1+ Some 1+ Some 1+ N/A  Right Triceps Nml 1+ Nml 1+ 3- Rapid Many 1+ Many 1+ Some 1+ N/A  Right Abd Poll Brev Nml 1+ Nml 1+ SMU Rapid All 1+ All 1+ All 1+ ATR  Right 1stDorInt Nml 3+ Nml 1+ 3- Rapid All 1+ All 1+ Nml Nml N/A  Right Deltoid Nml 1+ Nml 2+ 3- Rapid Many 1+ Some 1+ Many 1+ N/A  Right [place]eter Nml 1+ Nml Nml 1- Rapid Some 1+ Nml Nml Nml Nml N/A  Right T7 Parasp Nml 1+ Nml Nml 1- Rapid Some 1+ Some 1+ Some 1+ N/A      Waveforms:

## 2013-09-23 ENCOUNTER — Ambulatory Visit (INDEPENDENT_AMBULATORY_CARE_PROVIDER_SITE_OTHER): Payer: Medicare Other | Admitting: Neurology

## 2013-09-23 ENCOUNTER — Encounter: Payer: Self-pay | Admitting: Neurology

## 2013-09-23 VITALS — BP 148/92 | HR 72 | Resp 16

## 2013-09-23 DIAGNOSIS — G1221 Amyotrophic lateral sclerosis: Secondary | ICD-10-CM | POA: Insufficient documentation

## 2013-09-23 HISTORY — DX: Amyotrophic lateral sclerosis: G12.21

## 2013-09-23 NOTE — Patient Instructions (Signed)
We will place a referral to [facility] for home health services You are welcome to return to clinic as needed  For specific questions or concerns, please do not hesitate to call me

## 2013-09-23 NOTE — Progress Notes (Signed)
Follow-up Visit   Date: 09/23/2013   Elizabeth [name] MRN: [id number] DOB: Nov 22, 1933   Interim History: Elizabeth [name] is a 80 y.o. right-handed Caucasian female with history of hypertension, SVT, GERD, lumbar spinal stenosis s/p lumbar decompression and fusion (2005) returning to the clinic for follow-up of leg weakness and slurred speech.   The patient was accompanied to the clinic by son and his girlfriend Jeani Hawking) who also provides collateral information.    History of present illness: Starting around fall 2013, she developed low back pain and underwent epidural injection x 4 in April 2015. Following her second injection, she immediately developed tingling of the right toes which she recalls telling her pain medicine physician because it occurred while still in the office. Within a few weeks, she developed numbness of the right lower lateral leg and top of the foot. Her was told to follow-up with PCP who referred her to Dr. Melrose Nakayama. Electrodiagnostic testing showed acute on chronic lower lumbosacral radiculopathy on the right with ongoing denervation seen in the deep the superficial fibula nerves.   Around summer 2014, she developed right leg swelling and skin started sloughing off, (right > left). She saw dermatology who diagnosed her with vasculitis which was confirmed by Rheumatology and given short course of prednisone. This improved her swelling and skin changes, but her numbness/tingling remained unchanged.  In the summer of 2015, weakness of the left leg (proximal and distal) ensued. By thanksgiving, she was using a walker and by Christmas, she was wheelchair bound. She last stood up independently in December. Over the past several months, her leg weakness has steadily progressed and now she is wheelchair-bound. In March 2015, she noted problems with slurred speech. She also has difficulty swallowing both liquids and solids.   She is getting PT for her her arms. She has normal MBS which  was performed ~April 2015. She has not done speech, swallow therapy. She denies any shortness of breath and is currently sleeping on one pillow.   In early August 2015, she required assistance for transfers including toileting, but is managing by herself and with assistance of family. At her last visit with Dr. Melrose Nakayama, home healthcare referral was made. Her family his trying to get into an assisted living facility.   UPDATE 09/23/2013:  Patient is here to discuss results of EMG which showed active on chronic motor axon loss changes affecting bulbar, cervical, thoracic, and lumbosacral segments consistent with widespread disorder of anterior horn cells.  Since her last visit 3 weeks ago, patient is not unable to support any weight on her legs and has little, if any, movements of the lower extremities.  It is harder for her to support herself upright when sitting.  Her speech and swallow has also worsened.  She does not have any shortness of breath at rest or when laying down and continues to sleep on one pillow.  Denies any muscle cramps.    Medications:  Current Outpatient Prescriptions on File Prior to Visit  Medication Sig Dispense Refill  . aspirin 81 MG tablet Take 81 mg by mouth daily.      Marland Kitchen atenolol (TENORMIN) 25 MG tablet       . Cholecalciferol 10000 UNITS CAPS Take by mouth daily.      . fluticasone (FLONASE) 50 MCG/ACT nasal spray Place into both nostrils daily as needed for allergies or rhinitis.      Marland Kitchen gabapentin (NEURONTIN) 100 MG capsule       .  hydrochlorothiazide (HYDRODIURIL) 12.5 MG tablet       . meclizine (ANTIVERT) 25 MG tablet       . meloxicam (MOBIC) 7.5 MG tablet       . Multiple Vitamin (MULTI-VITAMINS) TABS Take by mouth.      Marland Kitchen omeprazole (PRILOSEC) 20 MG capsule       . traMADol (ULTRAM) 50 MG tablet        No current facility-administered medications on file prior to visit.    Allergies: No Known Allergies   Review of Systems:  CONSTITUTIONAL: No fevers,  chills, night sweats, or weight loss.   EYES: No visual changes or eye pain ENT: No hearing changes.  No history of nose bleeds.   RESPIRATORY: No cough, wheezing and shortness of breath.   CARDIOVASCULAR: Negative for chest pain, and palpitations.   GI: Negative for abdominal discomfort, blood in stools or black stools.  No recent change in bowel habits.   GU:  No history of incontinence.   MUSCLOSKELETAL: +history of joint pain or swelling.  No myalgias.   SKIN: Negative for lesions, rash, and itching.   ENDOCRINE: Negative for cold or heat intolerance, polydipsia or goiter.   PSYCH:  No depression or anxiety symptoms.   NEURO: As Above.   Vital Signs:  BP 148/92  Pulse 72  Resp 16  Neurological Exam: MENTAL STATUS including orientation to time, place, person, recent and remote memory, attention span and concentration, language, and fund of knowledge is normal.  Speech moderately dysarthric, 80% intelligible.  CRANIAL NERVES:  Pupils equal round and reactive to light.  Normal conjugate, extra-ocular eye movements in all directions of gaze.  No ptosis. Face is symmetric. There is evidence of facial weakness as evidenced by inability to maintain air in puffed cheeks, and orbicularis auris weakness (4/5). Snout reflex and Myerson sign is present.  Tongue strength is reduced 4/5 and she unable to completely tuck her tongue into cheek, tongue range of motion and movements is also severely reduced. Rare fasciculations were seen of in the tongue  MOTOR: Prominent lower extremity atrophy, especially of the quadriceps and tibialis anterior bilaterally, worse on the right. Fasciculations are seen over the right biceps, triceps, and forearm muscles with rare fasciculations involving the left upper extremity. No fasciculations are seen in the lower extremity. No pronator drift. Tone is reduced in the legs bilaterally.   Neck flexion 4/5  Right Upper Extremity:    Left Upper Extremity:    Deltoid   4+/5   Deltoid  5-/5   Biceps  4+/5   Biceps  5-/5   Triceps  4+/5   Triceps  5-/5   Wrist extensors  4+/5   Wrist extensors  5-/5   Wrist flexors  4+/5   Wrist flexors  5/5   Finger extensors  4-/5   Finger extensors  4+/5   Finger flexors  4-/5   Finger flexors  4+/5   Dorsal interossei  4-/5   Dorsal interossei  4+/5   Abductor pollicis  4-/5   Abductor pollicis  4+/5   Tone (Ashworth scale)  0   Tone (Ashworth scale)  0    Right Lower Extremity:    Left Lower Extremity:    Hip flexors  1/5   Hip flexors  3-/5   Hip extensors  1/5   Hip extensors  3-/5   Knee flexors  1/5   Knee flexors  2+/5   Knee extensors  1/5   Knee extensors  3/5   Dorsiflexors  0/5   Dorsiflexors  1-/5   Plantarflexors  0/5   Plantarflexors  1-/5   Toe extensors  0/5   Toe extensors  0/5   Toe flexors  0/5   Toe flexors  0/5   Tone (Ashworth scale)  0   Tone (Ashworth scale)  0   MSRs:  Right Left  brachioradialis  3+   brachioradialis  3+   biceps  3+   biceps  3+   triceps  3+   triceps  3+   patellar  3+   patellar  3+   ankle jerk  1+   ankle jerk  1+   Hoffman  no   Hoffman  no   plantar response  up   plantar response  mute     COORDINATION/GAIT: . Gait not assessed due to patient's severe lower extremity weakness.   Data: Labs 06/20/18/15: CK 210, sodium 139, potassium 1.4, creatinine 0.5, BUN creatinine ratio 32*, aldolase 6.1, vitamin B12 587, ESR 10, TSH 1.3   EMG 05/07/2012: Acute on chronic lower lumbosacral radiculopathy on the left. Ongoing denervation seen in the deep and superficial fibula nerves.   MRI brain without contrast 03/24/2013: Slight progression of periventricular white matter changes. This likely reflects progressive small vessel disease. No intracranial abnormality.   MRI lumbar spine without contrast 03/21/2012: Multilevel degenerative disc disease with disc protrusion at L2-L3, L3-L4, L4-L5, and L5-S1, with bilateral neural foraminal narrowing. Postsurgical changes are  noted.   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  There is no evidence of a generalized myopathy.  Labs 08/28/2013:  SPEP/UPEP with IFE:  No M protein; CK 120, aldolase 8.1, copper 126, ceruloplasmin 31   IMPRESSION/PLAN: Ms. Rouser is a delightful 80 year old female here for follow-up of progressive muscle weakness, dysphagia, and dysarthria.  Symptoms started with leg weakness in the Spring 2014 and over the past several month has involved speech and swallowing difficulty.  She does not complain of shortness of breath.  Her exam shows lower and upper motor neuron findings involving the bulbar, cervical, and lumbosacral regions.  Her recent EMG shows wide-spread active on chronic denervation involving the bulbar, cervical, thoracic, and lumbar regions and based on electrodiagnostic evaluation alone, she meets the [employer] [place] diagnostic criteria for motor neuron disease.   At her last visit, I checked for extrinsic causes of injury to the motor neurons which did not show any abnormalities. Based on her history, exam, and work-up is most consistent with amyotrophic lateral sclerosis (ALS).   I had an extensive discussion regarding the pathophysiology, etiology, natural course, and management of motor neuron disease with patient and her son including end-of-life goals.  She already has [facility] in place and is DNR/DNI.  Patient is not interested in having PEG or trach and wishes to focus on comfort.  I offered riluzole as it is the only FDA approved medication shown to increase survival in ALS, and patient declined given modest benefit.  Based on the relatively rapid deterioration of her motor  strength, I think she would be better served by home hospice and will send referral.    Goal at this unfortunate juncture is comfort care.  I will try to manage  questions/concerns via telephone because it is very difficult for patient to transfer.  She will always be welcome to follow-up with me as needed.   The duration of this appointment visit was 50 minutes of face-to-face time with the patient.  Greater than 50% of this time was spent in counseling, explanation of diagnosis, planning of further management, and coordination of care.   Thank you for allowing me to participate in patient's care.  If I can answer any additional questions, I would be pleased to do so.    Sincerely,    Hatsue Sime K. Posey Pronto, DO

## 2013-09-23 NOTE — Progress Notes (Signed)
Referral faxed to [facility] to [phone] with confirmation received to contact patient's son to set up care.   Note faxed to Dr Malvin Johns at [phone] and faxed to Dr Harrington Challenger at [phone] with confirmation received.

## 2013-09-25 ENCOUNTER — Telehealth: Payer: Self-pay | Admitting: *Deleted

## 2013-09-25 NOTE — Telephone Encounter (Signed)
Called patient to check on her per Dr. Eliane Decree request.  Patient said that she is doing as well as can be expected.  Hospice has been out to her home for the past 2 days.  Dr. Allena Katz notified.

## 2013-10-09 ENCOUNTER — Ambulatory Visit: Payer: Medicare Other | Admitting: Neurology

## 2013-10-13 ENCOUNTER — Telehealth: Payer: Self-pay | Admitting: *Deleted

## 2013-10-13 NOTE — Telephone Encounter (Signed)
I called patient to check on her and she said that she is doing good but just not able to talk very good.  She was difficult to understand but I could make out what she was saying.  She said the nurse is looking out for her.  She also wanted me to tell Dr. Allena Katz that she appreciates everything she has done for her.  Instructed her to call if she needs anything.

## 2013-11-05 ENCOUNTER — Encounter: Payer: Medicare Other | Admitting: Neurology

## 2013-11-20 ENCOUNTER — Telehealth: Payer: Self-pay | Admitting: Neurology

## 2013-11-20 NOTE — Telephone Encounter (Signed)
Returned called to WindsorStephanie, she was notified of advisement.

## 2013-11-20 NOTE — Telephone Encounter (Signed)
Stephanie from Hospice called wanting to know if Dr. Allena KatzPatel is able to Rx the pt something else for her pain. Please call Judeth CornfieldStephanie back # [phone]

## 2013-11-20 NOTE — Telephone Encounter (Signed)
Can you advise on this? Looks like Dr. Allena KatzPatel gave Rx for Tramadol.

## 2013-11-20 NOTE — Telephone Encounter (Signed)
Please let her know Dr. Allena KatzPatel is out of office and I am uncomfortable prescribing anything else aside from the Tramadol, in addition, our office does not prescribe narcotics. If she wants to speak to PCP, pls have them contact PCP as well. Pls forward message to Dr. Allena KatzPatel after as Lorain ChildesFYI. Thanks

## 2013-11-23 NOTE — Telephone Encounter (Signed)
Please contact hospice as they should have protocols in place to address pain-related issues.  Donika K. Allena KatzPatel, DO

## 2013-11-24 NOTE — Telephone Encounter (Signed)
Left message for Judeth CornfieldStephanie with hospice to call me back.

## 2013-11-25 ENCOUNTER — Telehealth: Payer: Self-pay | Admitting: *Deleted

## 2013-11-25 NOTE — Telephone Encounter (Signed)
Left message for Elizabeth [name] to call me back

## 2013-11-25 NOTE — Telephone Encounter (Signed)
Judeth CornfieldStephanie called to let us know that patient is having throbbing pain in her left knee at night.  She said it is like a burning pain.  Can we increase neurontin? She has asked me to call Sue Lushndrea at [phone] with any changes since she will be off work for the next 2 days.

## 2013-11-25 NOTE — Telephone Encounter (Signed)
Hospice sent a med list and patient is being treated with pain medication.

## 2013-11-25 NOTE — Telephone Encounter (Signed)
Called hospice office and requested for them to fax a medication list for patient.

## 2013-11-26 NOTE — Telephone Encounter (Signed)
OK to increase neurontin to 300mg  qhs and add lidocaine patch 5% to left knee.  Patient should be evaluated by hospice provider to be sure there is no skin lesion or swelling.  Elizabeth K. Allena KatzPatel, DO

## 2013-11-26 NOTE — Telephone Encounter (Signed)
Orders given to hospice nurse. (sharon)

## 2013-12-01 ENCOUNTER — Telehealth: Payer: Self-pay | Admitting: Neurology

## 2013-12-01 NOTE — Telephone Encounter (Signed)
Tawni CarnesGave Stephanie instructions per Dr. Allena KatzPatel.  Rx called to [facility] for 2 week supply since Hospice covers this amount.

## 2013-12-01 NOTE — Telephone Encounter (Signed)
This is so sad to hear, let's start amitriptyline 10mg  at bedtime.  Hospice needs to address anxiety - please request that their physicians address these end-of-life concerns.   Morna Flud K. Allena KatzPatel, DO

## 2013-12-01 NOTE — Telephone Encounter (Signed)
Judeth CornfieldStephanie, the Hospice nurse called regarding this pt. She states pt is having trouble sleeping and is experiencing some depression. Please call Judeth CornfieldStephanie back on her cell, confirmed CB# [phone] / Sherri S.

## 2013-12-01 NOTE — Telephone Encounter (Signed)
She wakes up around 1:00 am or 1:30 am crying.  Stays awake until about 5:00.  She asks her son to give her something to take her out of this world.  She has really declined rapidly.  Elizabeth [name] is requesting something for depression.

## 2014-01-04 ENCOUNTER — Telehealth: Payer: Self-pay | Admitting: Neurology

## 2014-01-04 NOTE — Telephone Encounter (Signed)
I called hospice and spoke with Judeth CornfieldStephanie (patient's nurse) and she said that patient is not appropriate for hospice home yet.  She is allowed to have a 5 day respite and hospice has offered several other alternatives for care.  She said that she and a Child psychotherapistsocial worker are going out there tomorrow and will let us know how that goes.

## 2014-01-04 NOTE — Telephone Encounter (Signed)
Noted  

## 2014-01-04 NOTE — Telephone Encounter (Signed)
Larita FifeLynn, pt's daughter-in-law called wanting to speak to a nurse regarding possible referring pt to a hospice care,  C/B [phone]

## 2014-01-11 NOTE — Telephone Encounter (Signed)
Morrie Sheldonshley, when you are able, can you f/u on this?  I just want to be sure Ms. Christell ConstantMoore is comfortable.  Thanks.

## 2014-01-11 NOTE — Telephone Encounter (Signed)
I called Larita FifeLynn to check on Elizabeth [name].  Left message for her to call me back.

## 2014-01-12 NOTE — Telephone Encounter (Signed)
Elizabeth [name] called back and said that patient is doing worse.  She now has a catheter.  Hospice came out and told her that she could move to the hospice home but patient refused.  Patient is totally dependent on others.  She does have caregivers that come in to help along with hospice nurses.  Encouraged her to use the hospice resources.  She agreed and was very thankful to Dr. Allena KatzPatel for all of her help.

## 2014-01-25 ENCOUNTER — Telehealth: Payer: Self-pay | Admitting: Neurology

## 2014-01-25 NOTE — Telephone Encounter (Signed)
Personally called and spoke with Clide Cliff (Ms. Mines's son) to express my sincere condolences.  Donika K. Allena Katz, DO

## 2014-01-25 NOTE — Telephone Encounter (Signed)
I spoke with Elizabeth [name] and she said that time of death was 1:11 pm.  I asked her about the death certificate and she said that you will be filling it out.

## 2014-01-25 NOTE — Telephone Encounter (Signed)
Stephanie from hospice called to inform Dr. Allena Katz that pt passed away 06-08-2022 02-05-2014. C/b [phone]

## 2014-02-22 DEATH — deceased
